# Patient Record
Sex: Female | Born: 1995 | Race: Black or African American | Hispanic: No | Marital: Single | Smoking: Current every day smoker
Health system: Southern US, Community
[De-identification: ages and names within clinical notes are randomized; demographics above are authoritative.]

## PROBLEM LIST (undated history)

## (undated) DIAGNOSIS — R569 Unspecified convulsions: Secondary | ICD-10-CM

---
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
## 2017-02-01 ENCOUNTER — Encounter (HOSPITAL_COMMUNITY): Payer: Self-pay | Admitting: Emergency Medicine

## 2017-02-01 ENCOUNTER — Emergency Department (HOSPITAL_COMMUNITY): Payer: Medicaid Other | Admitting: Certified Registered"

## 2017-02-01 ENCOUNTER — Emergency Department (HOSPITAL_COMMUNITY): Payer: Medicaid Other

## 2017-02-01 ENCOUNTER — Encounter (HOSPITAL_COMMUNITY)
Admission: EM | Disposition: A | Discharge status: Home / Self Care | Payer: Self-pay | Source: Home / Self Care | Attending: Orthopedic Surgery

## 2017-02-01 ENCOUNTER — Inpatient Hospital Stay (HOSPITAL_COMMUNITY)
Admission: EM | Admit: 2017-02-01 | Discharge: 2017-02-04 | DRG: 494 | Disposition: A | Discharge status: Home / Self Care | Payer: Medicaid Other | Attending: Orthopedic Surgery | Admitting: Orthopedic Surgery

## 2017-02-01 ENCOUNTER — Other Ambulatory Visit: Payer: Self-pay

## 2017-02-01 DIAGNOSIS — R52 Pain, unspecified: Secondary | ICD-10-CM

## 2017-02-01 DIAGNOSIS — Z23 Encounter for immunization: Secondary | ICD-10-CM

## 2017-02-01 DIAGNOSIS — S82251A Displaced comminuted fracture of shaft of right tibia, initial encounter for closed fracture: Principal | ICD-10-CM | POA: Diagnosis present

## 2017-02-01 DIAGNOSIS — F17200 Nicotine dependence, unspecified, uncomplicated: Secondary | ICD-10-CM | POA: Diagnosis present

## 2017-02-01 DIAGNOSIS — S82201A Unspecified fracture of shaft of right tibia, initial encounter for closed fracture: Secondary | ICD-10-CM | POA: Diagnosis present

## 2017-02-01 DIAGNOSIS — S82451A Displaced comminuted fracture of shaft of right fibula, initial encounter for closed fracture: Secondary | ICD-10-CM | POA: Diagnosis present

## 2017-02-01 DIAGNOSIS — R569 Unspecified convulsions: Secondary | ICD-10-CM | POA: Diagnosis present

## 2017-02-01 DIAGNOSIS — R1110 Vomiting, unspecified: Secondary | ICD-10-CM

## 2017-02-01 DIAGNOSIS — M79606 Pain in leg, unspecified: Secondary | ICD-10-CM

## 2017-02-01 DIAGNOSIS — Z419 Encounter for procedure for purposes other than remedying health state, unspecified: Secondary | ICD-10-CM

## 2017-02-01 DIAGNOSIS — Y92414 Local residential or business street as the place of occurrence of the external cause: Secondary | ICD-10-CM

## 2017-02-01 DIAGNOSIS — K5900 Constipation, unspecified: Secondary | ICD-10-CM | POA: Diagnosis not present

## 2017-02-01 HISTORY — DX: Unspecified convulsions: R56.9

## 2017-02-01 HISTORY — PX: TIBIA IM NAIL INSERTION: SHX2516

## 2017-02-01 LAB — COMPREHENSIVE METABOLIC PANEL WITH GFR
ALT: 12 U/L — ABNORMAL LOW (ref 14–54)
AST: 30 U/L (ref 15–41)
Albumin: 4 g/dL (ref 3.5–5.0)
Alkaline Phosphatase: 57 U/L (ref 38–126)
Anion gap: 14 (ref 5–15)
BUN: 11 mg/dL (ref 6–20)
CO2: 18 mmol/L — ABNORMAL LOW (ref 22–32)
Calcium: 8.9 mg/dL (ref 8.9–10.3)
Chloride: 108 mmol/L (ref 101–111)
Creatinine, Ser: 0.98 mg/dL (ref 0.44–1.00)
GFR calc Af Amer: >60 mL/min
GFR calc non Af Amer: >60 mL/min
Glucose, Bld: 127 mg/dL — ABNORMAL HIGH (ref 65–99)
Potassium: 3.4 mmol/L — ABNORMAL LOW (ref 3.5–5.1)
Sodium: 140 mmol/L (ref 135–145)
Total Bilirubin: 0.5 mg/dL (ref 0.3–1.2)
Total Protein: 6.8 g/dL (ref 6.5–8.1)

## 2017-02-01 LAB — CBC WITH DIFFERENTIAL/PLATELET
Basophils Absolute: 0 10*3/uL (ref 0.0–0.1)
Basophils Relative: 0 %
Eosinophils Absolute: 0.1 10*3/uL (ref 0.0–0.7)
Eosinophils Relative: 2 %
HCT: 38 % (ref 36.0–46.0)
Hemoglobin: 13 g/dL (ref 12.0–15.0)
Lymphocytes Relative: 46 %
Lymphs Abs: 2.8 10*3/uL (ref 0.7–4.0)
MCH: 30.5 pg (ref 26.0–34.0)
MCHC: 34.2 g/dL (ref 30.0–36.0)
MCV: 89.2 fL (ref 78.0–100.0)
Monocytes Absolute: 0.6 10*3/uL (ref 0.1–1.0)
Monocytes Relative: 10 %
Neutro Abs: 2.6 10*3/uL (ref 1.7–7.7)
Neutrophils Relative %: 42 %
Platelets: 356 10*3/uL (ref 150–400)
RBC: 4.26 MIL/uL (ref 3.87–5.11)
RDW: 12.5 % (ref 11.5–15.5)
WBC: 6.1 10*3/uL (ref 4.0–10.5)

## 2017-02-01 LAB — I-STAT BETA HCG BLOOD, ED (MC, WL, AP ONLY): I-stat hCG, quantitative: <5 m[IU]/mL

## 2017-02-01 SURGERY — INSERTION, INTRAMEDULLARY ROD, TIBIA
Anesthesia: General | Site: Leg Lower | Laterality: Right

## 2017-02-01 MED ORDER — TETANUS-DIPHTH-ACELL PERTUSSIS 5-2.5-18.5 LF-MCG/0.5 IM SUSP
0.5000 mL | Freq: Once | INTRAMUSCULAR | Status: AC
  Administered 2017-02-01: 0.5 mL via INTRAMUSCULAR
  Filled 2017-02-01: qty 0.5

## 2017-02-01 MED ORDER — FENTANYL CITRATE (PF) 100 MCG/2ML IJ SOLN
INTRAMUSCULAR | Status: AC
  Filled 2017-02-01: qty 2

## 2017-02-01 MED ORDER — TETANUS-DIPHTH-ACELL PERTUSSIS 5-2.5-18.5 LF-MCG/0.5 IM SUSP
INTRAMUSCULAR | Status: AC
  Filled 2017-02-01: qty 0.5

## 2017-02-01 MED ORDER — SUFENTANIL CITRATE 50 MCG/ML IV SOLN
INTRAVENOUS | Status: DC | PRN
  Administered 2017-02-01 (×3): 10 ug via INTRAVENOUS
  Administered 2017-02-01: 5 ug via INTRAVENOUS

## 2017-02-01 MED ORDER — HYDROMORPHONE HCL 1 MG/ML IJ SOLN
1.0000 mg | Freq: Once | INTRAMUSCULAR | Status: AC
  Administered 2017-02-01: 1 mg via INTRAVENOUS

## 2017-02-01 MED ORDER — PROPOFOL 10 MG/ML IV BOLUS
INTRAVENOUS | Status: DC | PRN
  Administered 2017-02-01: 160 mg via INTRAVENOUS

## 2017-02-01 MED ORDER — SUCCINYLCHOLINE CHLORIDE 200 MG/10ML IV SOSY
PREFILLED_SYRINGE | INTRAVENOUS | Status: DC | PRN
  Administered 2017-02-01: 120 mg via INTRAVENOUS

## 2017-02-01 MED ORDER — PROPOFOL 10 MG/ML IV BOLUS
INTRAVENOUS | Status: AC
  Filled 2017-02-01: qty 20

## 2017-02-01 MED ORDER — GLYCOPYRROLATE 0.2 MG/ML IJ SOLN
INTRAMUSCULAR | Status: DC | PRN
  Administered 2017-02-01: 0.2 mg via INTRAVENOUS

## 2017-02-01 MED ORDER — DEXAMETHASONE SODIUM PHOSPHATE 10 MG/ML IJ SOLN
INTRAMUSCULAR | Status: DC | PRN
  Administered 2017-02-01: 10 mg via INTRAVENOUS

## 2017-02-01 MED ORDER — ONDANSETRON HCL 4 MG/2ML IJ SOLN
4.0000 mg | Freq: Once | INTRAMUSCULAR | Status: AC
  Administered 2017-02-01: 4 mg via INTRAVENOUS
  Filled 2017-02-01: qty 2

## 2017-02-01 MED ORDER — FENTANYL CITRATE (PF) 100 MCG/2ML IJ SOLN
50.0000 ug | Freq: Once | INTRAMUSCULAR | Status: AC
  Administered 2017-02-01: 50 ug via INTRAVENOUS

## 2017-02-01 MED ORDER — CEFAZOLIN SODIUM-DEXTROSE 2-4 GM/100ML-% IV SOLN
INTRAVENOUS | Status: AC
  Filled 2017-02-01: qty 100

## 2017-02-01 MED ORDER — MIDAZOLAM HCL 5 MG/5ML IJ SOLN
INTRAMUSCULAR | Status: DC | PRN
  Administered 2017-02-01: 2 mg via INTRAVENOUS

## 2017-02-01 MED ORDER — LIDOCAINE 2% (20 MG/ML) 5 ML SYRINGE
INTRAMUSCULAR | Status: DC | PRN
  Administered 2017-02-01: 100 mg via INTRAVENOUS

## 2017-02-01 MED ORDER — HYDROMORPHONE HCL 1 MG/ML IJ SOLN
INTRAMUSCULAR | Status: AC
  Filled 2017-02-01: qty 1

## 2017-02-01 MED ORDER — SODIUM CHLORIDE 0.9 % IV BOLUS (SEPSIS)
1000.0000 mL | Freq: Once | INTRAVENOUS | Status: AC
  Administered 2017-02-01: 1000 mL via INTRAVENOUS

## 2017-02-01 MED ORDER — CEFAZOLIN SODIUM-DEXTROSE 2-3 GM-%(50ML) IV SOLR
INTRAVENOUS | Status: DC | PRN
  Administered 2017-02-01: 2 g via INTRAVENOUS

## 2017-02-01 MED ORDER — LACTATED RINGERS IV SOLN
INTRAVENOUS | Status: DC | PRN
  Administered 2017-02-01 – 2017-02-02 (×2): via INTRAVENOUS

## 2017-02-01 MED ORDER — 0.9 % SODIUM CHLORIDE (POUR BTL) OPTIME
TOPICAL | Status: DC | PRN
  Administered 2017-02-01: 1000 mL

## 2017-02-01 MED ORDER — PROPOFOL 10 MG/ML IV BOLUS
100.0000 mg | Freq: Once | INTRAVENOUS | Status: AC
  Administered 2017-02-01: 100 mg via INTRAVENOUS
  Filled 2017-02-01: qty 20

## 2017-02-01 MED ORDER — MIDAZOLAM HCL 2 MG/2ML IJ SOLN
INTRAMUSCULAR | Status: AC
  Filled 2017-02-01: qty 2

## 2017-02-01 MED ORDER — ROCURONIUM 10MG/ML (10ML) SYRINGE FOR MEDFUSION PUMP - OPTIME
INTRAVENOUS | Status: DC | PRN
  Administered 2017-02-01: 30 mg via INTRAVENOUS

## 2017-02-01 MED ORDER — SUFENTANIL CITRATE 50 MCG/ML IV SOLN
INTRAVENOUS | Status: AC
  Filled 2017-02-01: qty 1

## 2017-02-01 SURGICAL SUPPLY — 57 items
BANDAGE ACE 4X5 VEL STRL LF (GAUZE/BANDAGES/DRESSINGS) ×3 IMPLANT
BANDAGE ACE 6X5 VEL STRL LF (GAUZE/BANDAGES/DRESSINGS) ×3 IMPLANT
BANDAGE ESMARK 6X9 LF (GAUZE/BANDAGES/DRESSINGS) ×1 IMPLANT
BIT DRILL CALIBRATED 4.3X320MM (BIT) ×1 IMPLANT
BIT DRILL CROWE POINT TWST 4.3 (DRILL) ×1 IMPLANT
BIT DRILL TWSTPONT CRW 3.2X180 (DRILL) ×1 IMPLANT
BNDG ESMARK 6X9 LF (GAUZE/BANDAGES/DRESSINGS) ×3
COVER MAYO STAND STRL (DRAPES) ×3 IMPLANT
COVER SURGICAL LIGHT HANDLE (MISCELLANEOUS) ×3 IMPLANT
CUFF TOURNIQUET SINGLE 34IN LL (TOURNIQUET CUFF) IMPLANT
DRAPE C-ARM 42X72 X-RAY (DRAPES) ×3 IMPLANT
DRAPE C-ARMOR (DRAPES) ×3 IMPLANT
DRAPE HALF SHEET 40X57 (DRAPES) ×3 IMPLANT
DRAPE IMP U-DRAPE 54X76 (DRAPES) ×3 IMPLANT
DRAPE POUCH INSTRU U-SHP 10X18 (DRAPES) ×3 IMPLANT
DRAPE U-SHAPE 47X51 STRL (DRAPES) ×3 IMPLANT
DRAPE UTILITY XL STRL (DRAPES) ×6 IMPLANT
DRILL CALIBRATED 4.3X320MM (BIT) ×3
DRILL CROWE POINT TWIST 4.3 (DRILL) ×3
DRILL TWISTPOINT CROWE 3.2X180 (DRILL) ×3
DURAPREP 26ML APPLICATOR (WOUND CARE) ×3 IMPLANT
ELECT CAUTERY BLADE 6.4 (BLADE) ×3 IMPLANT
ELECT REM PT RETURN 9FT ADLT (ELECTROSURGICAL) ×3
ELECTRODE REM PT RTRN 9FT ADLT (ELECTROSURGICAL) ×1 IMPLANT
FACESHIELD WRAPAROUND (MASK) ×6 IMPLANT
GAUZE SPONGE 4X4 12PLY STRL (GAUZE/BANDAGES/DRESSINGS) ×3 IMPLANT
GAUZE XEROFORM 1X8 LF (GAUZE/BANDAGES/DRESSINGS) ×6 IMPLANT
GLOVE BIO SURGEON STRL SZ7.5 (GLOVE) ×3 IMPLANT
GLOVE BIOGEL PI IND STRL 8 (GLOVE) ×1 IMPLANT
GLOVE BIOGEL PI INDICATOR 8 (GLOVE) ×2
GOWN STRL REUS W/ TWL LRG LVL3 (GOWN DISPOSABLE) ×2 IMPLANT
GOWN STRL REUS W/ TWL XL LVL3 (GOWN DISPOSABLE) ×1 IMPLANT
GOWN STRL REUS W/TWL LRG LVL3 (GOWN DISPOSABLE) ×4
GOWN STRL REUS W/TWL XL LVL3 (GOWN DISPOSABLE) ×2
GUIDEPIN 3.2X17.5 THRD DISP (PIN) ×3 IMPLANT
GUIDEWIRE 2.6X80 BEAD TIP (WIRE) ×1 IMPLANT
GUIDWIRE 2.6X80 BEAD TIP (WIRE) ×3
KIT BASIN OR (CUSTOM PROCEDURE TRAY) ×3 IMPLANT
MANIFOLD NEPTUNE II (INSTRUMENTS) IMPLANT
NAIL TIBIAL PHOENIX 7.5X320 (Nail) ×3 IMPLANT
NS IRRIG 1000ML POUR BTL (IV SOLUTION) ×3 IMPLANT
PACK TOTAL JOINT (CUSTOM PROCEDURE TRAY) ×3 IMPLANT
PACK UNIVERSAL I (CUSTOM PROCEDURE TRAY) ×3 IMPLANT
PAD CAST 4YDX4 CTTN HI CHSV (CAST SUPPLIES) ×2 IMPLANT
PADDING CAST COTTON 4X4 STRL (CAST SUPPLIES) ×4
SCREW CORT TI DBL LEAD 4X26 (Screw) ×3 IMPLANT
SCREW CORT TI DBL LEAD 4X30 (Screw) ×3 IMPLANT
SCREW CORT TI DBL LEAD 4X34 (Screw) ×3 IMPLANT
SCREW CORT TI DBL LEAD 5X22 (Screw) ×3 IMPLANT
SCREW CORT TI DBL LEAD 5X36 (Screw) ×3 IMPLANT
SCREW CORT TI DBLE LEAD 5X28 (Screw) ×3 IMPLANT
STAPLER SKIN PROX WIDE 3.9 (STAPLE) ×3 IMPLANT
SUT MON AB 2-0 CT1 36 (SUTURE) ×3 IMPLANT
SUT VIC AB 0 CT1 27 (SUTURE) ×2
SUT VIC AB 0 CT1 27XBRD ANBCTR (SUTURE) ×1 IMPLANT
TOWEL OR 17X26 10 PK STRL BLUE (TOWEL DISPOSABLE) ×6 IMPLANT
WATER STERILE IRR 1000ML POUR (IV SOLUTION) ×3 IMPLANT

## 2017-02-01 NOTE — Sedation Documentation (Signed)
Reduction of right lower leg and splint applied
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
## 2017-02-01 NOTE — Progress Notes (Signed)
Orthopedic Tech Progress Note Patient Details:  Donna [name] 12/31/1995 [id number]  Ortho Devices Type of Ortho Device: Ace wrap, Short leg splint, Stirrup splint Ortho Device/Splint Interventions: Application   Post Interventions Patient Tolerated: Well Instructions Provided: Care of device   [name] 02/01/2017, 9:04 PM

## 2017-02-01 NOTE — ED Notes (Signed)
To the OR at this time

## 2017-02-01 NOTE — ED Notes (Signed)
Xray at bedside for post reduction films
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
## 2017-02-01 NOTE — ED Notes (Signed)
Pt remains alert and oriented x's 3 

## 2017-02-01 NOTE — Progress Notes (Signed)
Orthopedic Tech Progress Note Patient Details:  Donna [name] 11-18-1995 [id number]  Patient ID: Donna [name], female   DOB: 11-18-1995, 22 y.o.   MRN: [id number]   Saul FordyceJennifer C Zakkery Dorian 02/01/2017, 9:02 PMLevel 2 Trauma.

## 2017-02-01 NOTE — Sedation Documentation (Signed)
Propofol 25mg IV given 

## 2017-02-01 NOTE — Sedation Documentation (Signed)
Propofol 25mg  IV given

## 2017-02-01 NOTE — ED Notes (Signed)
All belongs given to pt's boyfriend per pt's requesr

## 2017-02-01 NOTE — Anesthesia Procedure Notes (Signed)
Procedure Name: Intubation Date/Time: 02/01/2017 11:16 PM Performed by: Claris Che, CRNA Pre-anesthesia Checklist: Patient identified, Emergency Drugs available, Suction available, Patient being monitored and Timeout performed Patient Re-evaluated:Patient Re-evaluated prior to induction Oxygen Delivery Method: Circle system utilized Preoxygenation: Pre-oxygenation with 100% oxygen Induction Type: IV induction, Rapid sequence and Cricoid Pressure applied Laryngoscope Size: Mac and 3 Grade View: Grade I Tube type: Oral Tube size: 7.5 mm Number of attempts: 1 Airway Equipment and Method: Stylet Placement Confirmation: ETT inserted through vocal cords under direct vision,  positive ETCO2 and breath sounds checked- equal and bilateral Secured at: 22 cm Tube secured with: Tape Dental Injury: Teeth and Oropharynx as per pre-operative assessment

## 2017-02-01 NOTE — ED Notes (Signed)
Sister at bedside.  Right leg elevated.  Splint in place, toes remain warm
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
## 2017-02-01 NOTE — ED Notes (Signed)
Pt screaming with pain, Dr. August Saucerean at bedside.  Dilaudid 1mg  IV given

## 2017-02-02 ENCOUNTER — Emergency Department (HOSPITAL_COMMUNITY): Payer: Medicaid Other

## 2017-02-02 ENCOUNTER — Other Ambulatory Visit: Payer: Self-pay

## 2017-02-02 DIAGNOSIS — S82251A Displaced comminuted fracture of shaft of right tibia, initial encounter for closed fracture: Secondary | ICD-10-CM | POA: Diagnosis present

## 2017-02-02 DIAGNOSIS — S82201A Unspecified fracture of shaft of right tibia, initial encounter for closed fracture: Secondary | ICD-10-CM | POA: Diagnosis present

## 2017-02-02 DIAGNOSIS — S82451A Displaced comminuted fracture of shaft of right fibula, initial encounter for closed fracture: Secondary | ICD-10-CM | POA: Diagnosis present

## 2017-02-02 DIAGNOSIS — K5900 Constipation, unspecified: Secondary | ICD-10-CM | POA: Diagnosis not present

## 2017-02-02 DIAGNOSIS — S82251B Displaced comminuted fracture of shaft of right tibia, initial encounter for open fracture type I or II: Secondary | ICD-10-CM | POA: Insufficient documentation

## 2017-02-02 DIAGNOSIS — Y92414 Local residential or business street as the place of occurrence of the external cause: Secondary | ICD-10-CM | POA: Diagnosis not present

## 2017-02-02 DIAGNOSIS — R569 Unspecified convulsions: Secondary | ICD-10-CM | POA: Diagnosis present

## 2017-02-02 DIAGNOSIS — M79606 Pain in leg, unspecified: Secondary | ICD-10-CM | POA: Diagnosis present

## 2017-02-02 DIAGNOSIS — F17200 Nicotine dependence, unspecified, uncomplicated: Secondary | ICD-10-CM | POA: Diagnosis present

## 2017-02-02 DIAGNOSIS — Z23 Encounter for immunization: Secondary | ICD-10-CM | POA: Diagnosis not present

## 2017-02-02 LAB — CBC
HCT: 33.4 % — ABNORMAL LOW (ref 36.0–46.0)
Hemoglobin: 11.1 g/dL — ABNORMAL LOW (ref 12.0–15.0)
MCH: 29.8 pg (ref 26.0–34.0)
MCHC: 33.2 g/dL (ref 30.0–36.0)
MCV: 89.8 fL (ref 78.0–100.0)
Platelets: 296 10*3/uL (ref 150–400)
RBC: 3.72 MIL/uL — AB (ref 3.87–5.11)
RDW: 13 % (ref 11.5–15.5)
WBC: 12 10*3/uL — ABNORMAL HIGH (ref 4.0–10.5)

## 2017-02-02 LAB — CREATININE, SERUM
Creatinine, Ser: 0.88 mg/dL (ref 0.44–1.00)
GFR calc Af Amer: >60 mL/min (ref >60)
GFR calc non Af Amer: >60 mL/min (ref >60)

## 2017-02-02 LAB — HIV ANTIBODY (ROUTINE TESTING W REFLEX): HIV Screen 4th Generation wRfx: NONREACTIVE

## 2017-02-02 MED ORDER — OXYCODONE HCL 5 MG PO TABS
5.0000 mg | ORAL_TABLET | ORAL | Status: DC | PRN
  Administered 2017-02-02 (×4): 10 mg via ORAL
  Administered 2017-02-03: 5 mg via ORAL
  Administered 2017-02-04 (×2): 10 mg via ORAL
  Filled 2017-02-02 (×6): qty 2
  Filled 2017-02-02: qty 1
  Filled 2017-02-02 (×2): qty 2

## 2017-02-02 MED ORDER — FENTANYL CITRATE (PF) 100 MCG/2ML IJ SOLN
INTRAMUSCULAR | Status: AC
  Filled 2017-02-02: qty 2

## 2017-02-02 MED ORDER — ONDANSETRON HCL 4 MG PO TABS
4.0000 mg | ORAL_TABLET | Freq: Four times a day (QID) | ORAL | Status: DC | PRN
  Administered 2017-02-03: 4 mg via ORAL
  Filled 2017-02-02 (×2): qty 1

## 2017-02-02 MED ORDER — DEXAMETHASONE SODIUM PHOSPHATE 10 MG/ML IJ SOLN
INTRAMUSCULAR | Status: AC
  Filled 2017-02-02: qty 1

## 2017-02-02 MED ORDER — ACETAMINOPHEN 500 MG PO TABS
1000.0000 mg | ORAL_TABLET | Freq: Four times a day (QID) | ORAL | Status: DC
  Administered 2017-02-02 – 2017-02-04 (×9): 1000 mg via ORAL
  Filled 2017-02-02 (×9): qty 2

## 2017-02-02 MED ORDER — ENOXAPARIN SODIUM 40 MG/0.4ML ~~LOC~~ SOLN
40.0000 mg | SUBCUTANEOUS | Status: DC
  Administered 2017-02-02 – 2017-02-03 (×2): 40 mg via SUBCUTANEOUS
  Filled 2017-02-02 (×2): qty 0.4

## 2017-02-02 MED ORDER — ONDANSETRON HCL 4 MG/2ML IJ SOLN
4.0000 mg | Freq: Four times a day (QID) | INTRAMUSCULAR | Status: DC | PRN
  Administered 2017-02-02: 4 mg via INTRAVENOUS
  Filled 2017-02-02 (×2): qty 2

## 2017-02-02 MED ORDER — ONDANSETRON HCL 4 MG/2ML IJ SOLN
INTRAMUSCULAR | Status: DC | PRN
  Administered 2017-02-02: 4 mg via INTRAVENOUS

## 2017-02-02 MED ORDER — METHOCARBAMOL 500 MG PO TABS
500.0000 mg | ORAL_TABLET | Freq: Four times a day (QID) | ORAL | Status: DC | PRN
  Administered 2017-02-02 – 2017-02-04 (×4): 500 mg via ORAL
  Filled 2017-02-02 (×4): qty 1

## 2017-02-02 MED ORDER — SENNA 8.6 MG PO TABS
1.0000 | ORAL_TABLET | Freq: Two times a day (BID) | ORAL | Status: DC
  Administered 2017-02-02 – 2017-02-04 (×5): 8.6 mg via ORAL
  Filled 2017-02-02 (×5): qty 1

## 2017-02-02 MED ORDER — ONDANSETRON HCL 4 MG/2ML IJ SOLN: 4.0000 mg | Freq: Once | INTRAMUSCULAR | Status: DC | PRN

## 2017-02-02 MED ORDER — SUGAMMADEX SODIUM 200 MG/2ML IV SOLN
INTRAVENOUS | Status: AC
  Filled 2017-02-02: qty 2

## 2017-02-02 MED ORDER — OXYCODONE HCL 5 MG PO TABS: 5.0000 mg | ORAL_TABLET | ORAL | 0 refills | Status: AC | PRN

## 2017-02-02 MED ORDER — ONDANSETRON 4 MG PO TBDP: 4.0000 mg | ORAL_TABLET | Freq: Three times a day (TID) | ORAL | 0 refills | Status: AC | PRN

## 2017-02-02 MED ORDER — SUGAMMADEX SODIUM 200 MG/2ML IV SOLN
INTRAVENOUS | Status: DC | PRN
  Administered 2017-02-02: 200 mg via INTRAVENOUS

## 2017-02-02 MED ORDER — FENTANYL CITRATE (PF) 100 MCG/2ML IJ SOLN
25.0000 ug | INTRAMUSCULAR | Status: DC | PRN
  Administered 2017-02-02 (×3): 50 ug via INTRAVENOUS

## 2017-02-02 MED ORDER — SUCCINYLCHOLINE CHLORIDE 200 MG/10ML IV SOSY
PREFILLED_SYRINGE | INTRAVENOUS | Status: AC
  Filled 2017-02-02: qty 10

## 2017-02-02 MED ORDER — METHOCARBAMOL 1000 MG/10ML IJ SOLN
500.0000 mg | Freq: Four times a day (QID) | INTRAVENOUS | Status: DC | PRN
  Filled 2017-02-02: qty 5

## 2017-02-02 MED ORDER — SODIUM CHLORIDE 0.9 % IJ SOLN
INTRAMUSCULAR | Status: AC
  Filled 2017-02-02: qty 10

## 2017-02-02 MED ORDER — HYDROMORPHONE HCL 1 MG/ML IJ SOLN: 0.5000 mg | INTRAMUSCULAR | Status: DC | PRN

## 2017-02-02 MED ORDER — HYDROMORPHONE HCL 1 MG/ML IJ SOLN
1.0000 mg | INTRAMUSCULAR | Status: DC | PRN
  Administered 2017-02-02 – 2017-02-04 (×16): 1 mg via INTRAVENOUS
  Filled 2017-02-02 (×16): qty 1

## 2017-02-02 MED ORDER — CEFAZOLIN SODIUM-DEXTROSE 1-4 GM/50ML-% IV SOLN
1.0000 g | Freq: Three times a day (TID) | INTRAVENOUS | Status: AC
  Administered 2017-02-02 (×2): 1 g via INTRAVENOUS
  Filled 2017-02-02 (×2): qty 50

## 2017-02-02 NOTE — Discharge Instructions (Signed)
Orthopedic discharge instructions:   -50% weightbearing to the right lower extremity with crutches and/or walker.  Maintain cam boot when weightbearing.  You may take off the boot to sleep at nighttime. -You may remove the Ace bandages on postoperative day #4.  The bandages applied to the incision should be maintained until your postop follow-up.  It is okay to begin showering once the Ace wrap has been removed. -Elevate the right lower extremity with "toes above nose" as much as possible. -Apply ice to the lower extremity on the right side 20-30 minutes at a time per hour during the daytime. -For the prevention of blood clots take a baby aspirin twice daily for 6 weeks. -Return to clinic to see Dr. Aundria Rudogers in 2-3 weeks.

## 2017-02-02 NOTE — Progress Notes (Signed)
Orthopedic Tech Progress Note Patient Details:  Donna [name] [birth year] [id number]  Ortho Devices Type of Ortho Device: CAM walker Ortho Device/Splint Location: rle Ortho Device/Splint Interventions: Ordered, Application, Adjustment   Post Interventions Patient Tolerated: Well Instructions Provided: Care of device, Adjustment of device   Trinna PostMartinez, Trebor Galdamez J 02/02/2017, 2:31 AM
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
## 2017-02-02 NOTE — Transfer of Care (Signed)
Immediate Anesthesia Transfer of Care Note  Patient: [name]  Procedure(s) Performed: INTRAMEDULLARY (IM) NAIL TIBIAL (Right Leg Lower)  Patient Location: PACU  Anesthesia Type:General  Level of Consciousness: oriented, sedated, drowsy, patient cooperative and responds to stimulation  Airway & Oxygen Therapy: Patient Spontanous Breathing and Patient connected to nasal cannula oxygen  Post-op Assessment: Report given to RN, Post -op Vital signs reviewed and stable and Patient moving all extremities X 4  Post vital signs: Reviewed and stable  Last Vitals:  Vitals:   02/01/17 2230 02/01/17 2232  BP: 123/75 135/68  Pulse: 70 92  Resp: 17 18  SpO2: 100% 100%    Last Pain:  Vitals:   02/01/17 2107  TempSrc:   PainSc: 10-Worst pain ever      Patients Stated Pain Goal: 10 (02/01/17 2107)  Complications: No apparent anesthesia complications

## 2017-02-02 NOTE — Brief Op Note (Signed)
02/01/2017 - 02/02/2017  1:34 AM  PATIENT:  Donna [name]  22 y.o. female  PRE-OPERATIVE DIAGNOSIS:  RIGHT TIBIA FRACTURE  POST-OPERATIVE DIAGNOSIS:  Right Tibia fracture  PROCEDURE:  Procedure(s): INTRAMEDULLARY (IM) NAIL TIBIAL (Right)  SURGEON:  Surgeon(s) and Role:    * Yolonda Kidaogers, Jason Patrick, MD - Primary  PHYSICIAN ASSISTANT:   ASSISTANTS: none   ANESTHESIA:   general  EBL:  60 mL   BLOOD ADMINISTERED:none  DRAINS: none   LOCAL MEDICATIONS USED:  NONE  SPECIMEN:  No Specimen  DISPOSITION OF SPECIMEN:  N/A  COUNTS:  YES  TOURNIQUET:  * Missing tourniquet times found for documented tourniquets in log: [id number] *  DICTATION: .Note written in EPIC  PLAN OF CARE: Admit to inpatient   PATIENT DISPOSITION:  PACU - hemodynamically stable.   Delay start of Pharmacological VTE agent (>24hrs) due to surgical blood loss or risk of bleeding: not applicable

## 2017-02-02 NOTE — Progress Notes (Signed)
Subjective: 1 Day Post-Op Procedure(s) (LRB): INTRAMEDULLARY (IM) NAIL TIBIAL (Right) Patient reports pain as moderate. Reports pain about the lower leg, soreness at the knee. No other c/o. Denies numbness or tingling. No CP, SOB, fever, chills, N/V.    Objective: Vital signs in last 24 hours: Temp:  [98.1 F (36.7 C)-99.3 F (37.4 C)] 99 F (37.2 C) (01/19 0426) Pulse Rate:  [59-122] 59 (01/19 0426) Resp:  [8-25] 16 (01/19 0426) BP: (89-164)/(67-151) 125/69 (01/19 0426) SpO2:  [97 %-100 %] 99 % (01/19 0426) Weight:  [52.6 kg (116 lb)] 52.6 kg (116 lb) (01/18 2202)  Intake/Output from previous day: 01/18 0701 - 01/19 0700 In: 2600 [I.V.:2600] Out: 1110 [Urine:1050; Blood:60] Intake/Output this shift: No intake/output data recorded.  Recent Labs    02/01/17 2022 02/02/17 0517  HGB 13.0 11.1*   Recent Labs    02/01/17 2022 02/02/17 0517  WBC 6.1 12.0*  RBC 4.26 3.72*  HCT 38.0 33.4*  PLT 356 296   Recent Labs    02/01/17 2022 02/02/17 0517  NA 140  --   K 3.4*  --   CL 108  --   CO2 18*  --   BUN 11  --   CREATININE 0.98 0.88  GLUCOSE 127*  --   CALCIUM 8.9  --    No results for input(s): LABPT, INR in the last 72 hours.  Neurologically intact ABD soft Neurovascular intact Sensation intact distally Intact pulses distally Dorsiflexion/Plantar flexion intact Incision: dressing C/D/I and no drainage No cellulitis present Compartment soft no calf pain or sign of DVT  Assessment/Plan: 1 Day Post-Op Procedure(s) (LRB): INTRAMEDULLARY (IM) NAIL TIBIAL (Right) Advance diet Up with therapy D/C IV fluids  50% WB RLE, remain in CAM walker Ice and elevate RLE to reduce swelling Lovenox for DVT ppx in hospital, D/C on ASA Continue to observe overnight tonight for compartment syndrome Possible D/C tomorrow if progressing well and pain controlled  Naiomy Watters M. 02/02/2017, 8:07 AM

## 2017-02-03 ENCOUNTER — Inpatient Hospital Stay (HOSPITAL_COMMUNITY): Payer: Medicaid Other

## 2017-02-03 MED ORDER — POLYETHYLENE GLYCOL 3350 17 G PO PACK
17.0000 g | PACK | Freq: Every day | ORAL | Status: DC
  Administered 2017-02-03 – 2017-02-04 (×2): 17 g via ORAL
  Filled 2017-02-03 (×2): qty 1

## 2017-02-03 MED ORDER — PROMETHAZINE HCL 25 MG/ML IJ SOLN
25.0000 mg | Freq: Four times a day (QID) | INTRAMUSCULAR | Status: DC | PRN
  Administered 2017-02-03 (×2): 25 mg via INTRAVENOUS
  Filled 2017-02-03 (×4): qty 1

## 2017-02-03 MED ORDER — BISACODYL 10 MG RE SUPP: 10.0000 mg | Freq: Every day | RECTAL | Status: DC | PRN

## 2017-02-03 NOTE — Anesthesia Postprocedure Evaluation (Signed)
Anesthesia Post Note  Patient: Donna [name]  Procedure(s) Performed: INTRAMEDULLARY (IM) NAIL TIBIAL (Right Leg Lower)     Patient location during evaluation: PACU Anesthesia Type: General Level of consciousness: awake and alert Pain management: pain level controlled Vital Signs Assessment: post-procedure vital signs reviewed and stable Respiratory status: spontaneous breathing, nonlabored ventilation, respiratory function stable and patient connected to nasal cannula oxygen Cardiovascular status: blood pressure returned to baseline and stable Postop Assessment: no apparent nausea or vomiting Anesthetic complications: no    Last Vitals:  Vitals:   02/03/17 0608 02/03/17 0832  BP: 132/75 125/76  Pulse: 72 62  Resp:  18  Temp: 37.3 C 37 C  SpO2: 100% 100%    Last Pain:  Vitals:   02/03/17 0832  TempSrc: Oral  PainSc:                  Charon Akamine COKER
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
## 2017-02-03 NOTE — Progress Notes (Signed)
Pt has had nausea and has vomited 2x this morning. Vomiting seems to be triggered by ingestion of food. RN called MD office to notify of what is going on with the patient will medicate and continue to monitor.

## 2017-02-03 NOTE — Progress Notes (Signed)
RN called with report that the patient had been dealing with nausea and vomiting with oral intake of medications and food. Patient denies BM since surgery but reports flatus. RN reports normal bowel sounds and no abdominal distention. Changed patient to clear liquid diet at this time, add phenergan IV in place of Zofran, add Miralax and Dulcolax suppository. Will get plain film of abdomen to check for any signs of obstruction. Will advance diet as patient progresses. Discussed plan of care with Dr. Ranee Gosselinonald Gioffre, MD  Dimitri PedAmber Diante Barley, PA-C

## 2017-02-03 NOTE — Anesthesia Preprocedure Evaluation (Signed)
Anesthesia Evaluation  Patient identified by MRN, date of birth, ID band Patient awake    Reviewed: Allergy & Precautions, NPO status , Patient's Chart, lab work & pertinent test results  Airway Mallampati: II  TM Distance: >3 FB Neck ROM: Full    Dental  (+) Teeth Intact, Dental Advisory Given   Pulmonary Current Smoker,    breath sounds clear to auscultation       Cardiovascular  Rhythm:Regular     Neuro/Psych    GI/Hepatic   Endo/Other    Renal/GU      Musculoskeletal   Abdominal   Peds  Hematology   Anesthesia Other Findings   Reproductive/Obstetrics                             Anesthesia Physical Anesthesia Plan  ASA: II and emergent  Anesthesia Plan: General   Post-op Pain Management:    Induction: Intravenous  PONV Risk Score and Plan: Ondansetron and Dexamethasone  Airway Management Planned: Oral ETT  Additional Equipment:   Intra-op Plan:   Post-operative Plan: Extubation in OR  Informed Consent: I have reviewed the patients History and Physical, chart, labs and discussed the procedure including the risks, benefits and alternatives for the proposed anesthesia with the patient or authorized representative who has indicated his/her understanding and acceptance.   Dental advisory given  Plan Discussed with: CRNA and Anesthesiologist  Anesthesia Plan Comments:         Anesthesia Quick Evaluation
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
## 2017-02-05 ENCOUNTER — Emergency Department (HOSPITAL_COMMUNITY)
Admission: EM | Admit: 2017-02-05 | Discharge: 2017-02-05 | Disposition: A | Discharge status: Home / Self Care | Payer: Medicaid Other | Attending: Emergency Medicine | Admitting: Emergency Medicine

## 2017-02-05 ENCOUNTER — Other Ambulatory Visit: Payer: Self-pay

## 2017-02-05 ENCOUNTER — Encounter (HOSPITAL_COMMUNITY): Payer: Self-pay | Admitting: Emergency Medicine

## 2017-02-05 ENCOUNTER — Encounter (HOSPITAL_COMMUNITY): Payer: Self-pay | Admitting: Orthopedic Surgery

## 2017-02-05 DIAGNOSIS — F17210 Nicotine dependence, cigarettes, uncomplicated: Secondary | ICD-10-CM | POA: Insufficient documentation

## 2017-02-05 DIAGNOSIS — G8918 Other acute postprocedural pain: Secondary | ICD-10-CM

## 2017-02-05 DIAGNOSIS — M79604 Pain in right leg: Secondary | ICD-10-CM | POA: Diagnosis present

## 2017-02-05 MED ORDER — MORPHINE SULFATE (PF) 4 MG/ML IV SOLN
4.0000 mg | Freq: Once | INTRAVENOUS | Status: AC
  Administered 2017-02-05: 4 mg via INTRAMUSCULAR
  Filled 2017-02-05: qty 1

## 2017-02-05 MED ORDER — IBUPROFEN 600 MG PO TABS: 600.0000 mg | ORAL_TABLET | Freq: Four times a day (QID) | ORAL | 0 refills | Status: AC | PRN

## 2017-02-05 MED ORDER — KETOROLAC TROMETHAMINE 30 MG/ML IJ SOLN
30.0000 mg | Freq: Once | INTRAMUSCULAR | Status: AC
  Administered 2017-02-05: 30 mg via INTRAMUSCULAR
  Filled 2017-02-05: qty 1
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
## 2017-02-05 NOTE — Discharge Instructions (Signed)
Alternate your home Percocet with ibuprofen as prescribed every 6 hours.  Please call Dr. Aundria Rudogers office to make them aware of your visit and your increased pain.  They may want to see you sooner than your currently scheduled follow-up appointment.  Please return to the emergency department if you develop any new or worsening symptoms including numbness of your foot, significant swelling of your leg or your leg feeling hard, or any other new or concerning symptoms.
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
## 2017-02-05 NOTE — ED Notes (Signed)
Pt called out crying and yelling  States she cannot wait for pain medication she is hurting too bad  Attempted to explain to pt that there is a long wait time and she has to be pt  Pt started yelling and would not quiet down for this recorder to attempt to explain anything else  Pt has taken off her shirt states she is hot and hurting and her pain is too bad and she needs something for her pain now  Pt continues to yell and cry  Pt states she told the ambulance that she did not want to come here and they brought her anyway

## 2017-02-05 NOTE — ED Notes (Signed)
Pt given crutches and instructed on how to use them, pt also stated she has found a ride.
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
## 2017-02-08 ENCOUNTER — Other Ambulatory Visit: Payer: Self-pay

## 2017-02-08 ENCOUNTER — Emergency Department (HOSPITAL_COMMUNITY)
Admission: EM | Admit: 2017-02-08 | Discharge: 2017-02-08 | Disposition: A | Discharge status: Home / Self Care | Payer: Medicaid Other | Attending: Emergency Medicine | Admitting: Emergency Medicine

## 2017-02-08 ENCOUNTER — Encounter (HOSPITAL_COMMUNITY): Payer: Self-pay

## 2017-02-08 ENCOUNTER — Emergency Department (HOSPITAL_COMMUNITY): Payer: Medicaid Other

## 2017-02-08 DIAGNOSIS — Z79899 Other long term (current) drug therapy: Secondary | ICD-10-CM | POA: Diagnosis not present

## 2017-02-08 DIAGNOSIS — F17210 Nicotine dependence, cigarettes, uncomplicated: Secondary | ICD-10-CM | POA: Diagnosis not present

## 2017-02-08 DIAGNOSIS — G8918 Other acute postprocedural pain: Secondary | ICD-10-CM | POA: Insufficient documentation

## 2017-02-08 DIAGNOSIS — M79604 Pain in right leg: Secondary | ICD-10-CM

## 2017-02-08 MED ORDER — IBUPROFEN 400 MG PO TABS
600.0000 mg | ORAL_TABLET | Freq: Once | ORAL | Status: AC
  Administered 2017-02-08: 11:00:00 600 mg via ORAL
  Filled 2017-02-08: qty 1

## 2017-02-08 MED ORDER — OXYCODONE-ACETAMINOPHEN 5-325 MG PO TABS
1.0000 | ORAL_TABLET | Freq: Once | ORAL | Status: AC
  Administered 2017-02-08: 1 via ORAL
  Filled 2017-02-08: qty 1

## 2017-02-08 NOTE — Discharge Instructions (Addendum)
You have an appointment scheduled with Dr. Aund[facility] on February 1 at 10 AM, they also have a pain medication prescription waiting for you which you can go to the office and pick up today.  Because of this I will not be prescribing any pain medications from the Ed. you may take ibuprofen in addition to pain medication as needed, continue to use crutches and walker to get around and remain in boot.  Try to listen to your body and do not take pain medication unless you truly needed.  If you have significantly worsened pain, numbness, tingling or weakness of the leg return to the ED otherwise please follow-up with orthopedics, if you have new symptoms you can also call their office.
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
## 2017-02-08 NOTE — Progress Notes (Addendum)
11:17am- CSW spoke with pt at bedside. Pt reports that pt is constanclyt in pain and thinks that people don't believe pt. CSW offered emotional support for pt as well as expalined the issues with taking opioids not as prescribed. Pt reports that pt is taking medications as directed. CSW provided pt with resources in the [place] areas at this time. There are no further CSW needs. CSW signing off.  CSW went to speak with pt at bedside to discuss currernt needs. Pt was not in room. CSW received conatct information for pt's school social worker Ms. Edwards. CSW spoke with Ms. Edwards [phone]) regarding her work with pt. CSW was informed that she worked with pt while in school and that pt had just recently graduated from high school as of Wednesday. Ms. Randa Evensdwards informed CSW that pt's mother passed away a few years ago and pt has been homeless and now living with sister. Pt reportedly sleeps on a blow up mattress at the sisters house, however CSW was informed that she has provided pt with giftcards, bus voucher, as well as other needed materials to assists pt in getting better things to sleep on. CSW was made aware by Ms. Edwards that pt is currently upset with boyfriend as bf mentioned "she is using these pills in place of weed". Ms. Randa Evensdwards posed the idea that pt may be overly using the medicine that was given to pt for leg while here at the hospital. Ms. Randa Evensdwards report that pt takes the medicine so that pain doesn't occur and takes 2-4  Pills each time.     Claude MangesKierra S. Walta [place], MSW, LCSW-A Emergency Department Clinical Social Worker [phone]

## 2017-02-08 NOTE — ED Notes (Signed)
Given crutches.

## 2017-02-08 NOTE — ED Notes (Signed)
Pt to xray
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
